# Patient Record
Sex: Male | Born: 1975 | Race: White | Hispanic: No | Marital: Single | Smoking: Never smoker
Health system: Southern US, Academic
[De-identification: ages and names within clinical notes are randomized; demographics above are authoritative.]

---

## 2004-06-18 ENCOUNTER — Ambulatory Visit (INDEPENDENT_AMBULATORY_CARE_PROVIDER_SITE_OTHER): Payer: Self-pay | Admitting: ORTHOPEDIC, SPORTS MEDICINE

## 2004-12-24 ENCOUNTER — Ambulatory Visit (INDEPENDENT_AMBULATORY_CARE_PROVIDER_SITE_OTHER): Payer: Self-pay | Admitting: Orthopaedic Surgery of the Spine

## 2005-02-18 ENCOUNTER — Other Ambulatory Visit (HOSPITAL_COMMUNITY): Payer: Self-pay | Admitting: Orthopaedic Surgery of the Spine

## 2005-02-19 ENCOUNTER — Inpatient Hospital Stay (HOSPITAL_COMMUNITY): Payer: Self-pay | Admitting: Orthopaedic Surgery of the Spine

## 2005-03-11 ENCOUNTER — Other Ambulatory Visit (INDEPENDENT_AMBULATORY_CARE_PROVIDER_SITE_OTHER): Payer: Self-pay | Admitting: Orthopaedic Surgery of the Spine

## 2005-05-13 ENCOUNTER — Other Ambulatory Visit (INDEPENDENT_AMBULATORY_CARE_PROVIDER_SITE_OTHER): Payer: Self-pay | Admitting: Orthopaedic Surgery of the Spine

## 2005-08-12 ENCOUNTER — Ambulatory Visit (INDEPENDENT_AMBULATORY_CARE_PROVIDER_SITE_OTHER): Payer: Self-pay | Admitting: Orthopaedic Surgery of the Spine

## 2006-02-17 ENCOUNTER — Ambulatory Visit (INDEPENDENT_AMBULATORY_CARE_PROVIDER_SITE_OTHER): Payer: Self-pay | Admitting: Orthopaedic Surgery of the Spine

## 2007-06-24 ENCOUNTER — Encounter (INDEPENDENT_AMBULATORY_CARE_PROVIDER_SITE_OTHER): Payer: BC Managed Care – PPO | Admitting: Orthopaedic Surgery of the Spine

## 2007-07-22 ENCOUNTER — Ambulatory Visit (INDEPENDENT_AMBULATORY_CARE_PROVIDER_SITE_OTHER): Payer: BC Managed Care – PPO

## 2007-07-22 ENCOUNTER — Other Ambulatory Visit (INDEPENDENT_AMBULATORY_CARE_PROVIDER_SITE_OTHER): Payer: Self-pay | Admitting: Orthopaedic Surgery of the Spine

## 2007-07-22 ENCOUNTER — Encounter (INDEPENDENT_AMBULATORY_CARE_PROVIDER_SITE_OTHER): Payer: BC Managed Care – PPO | Admitting: Orthopaedic Surgery of the Spine

## 2007-07-22 DIAGNOSIS — M549 Dorsalgia, unspecified: Secondary | ICD-10-CM

## 2007-07-22 DIAGNOSIS — Z09 Encounter for follow-up examination after completed treatment for conditions other than malignant neoplasm: Secondary | ICD-10-CM

## 2007-07-23 ENCOUNTER — Other Ambulatory Visit (INDEPENDENT_AMBULATORY_CARE_PROVIDER_SITE_OTHER): Payer: Self-pay | Admitting: Orthopaedic Surgery of the Spine

## 2007-08-17 ENCOUNTER — Other Ambulatory Visit (HOSPITAL_COMMUNITY): Payer: Self-pay

## 2007-08-17 ENCOUNTER — Encounter (INDEPENDENT_AMBULATORY_CARE_PROVIDER_SITE_OTHER): Payer: BC Managed Care – PPO | Admitting: Orthopaedic Surgery of the Spine

## 2007-08-25 ENCOUNTER — Ambulatory Visit (HOSPITAL_COMMUNITY): Payer: BC Managed Care – PPO

## 2007-08-25 ENCOUNTER — Other Ambulatory Visit (HOSPITAL_COMMUNITY): Payer: Self-pay

## 2007-08-31 ENCOUNTER — Ambulatory Visit (HOSPITAL_COMMUNITY): Payer: BC Managed Care – PPO

## 2007-08-31 ENCOUNTER — Encounter (INDEPENDENT_AMBULATORY_CARE_PROVIDER_SITE_OTHER): Payer: BC Managed Care – PPO | Admitting: Orthopaedic Surgery of the Spine

## 2007-08-31 ENCOUNTER — Other Ambulatory Visit (HOSPITAL_COMMUNITY): Payer: Self-pay

## 2007-09-07 ENCOUNTER — Ambulatory Visit (HOSPITAL_COMMUNITY): Payer: BC Managed Care – PPO

## 2007-09-07 ENCOUNTER — Other Ambulatory Visit (HOSPITAL_COMMUNITY): Payer: Self-pay

## 2007-09-07 ENCOUNTER — Ambulatory Visit (HOSPITAL_BASED_OUTPATIENT_CLINIC_OR_DEPARTMENT_OTHER)
Admission: RE | Admit: 2007-09-07 | Discharge: 2007-09-07 | Disposition: A | Discharge status: Home / Self Care | Payer: BC Managed Care – PPO | Source: Ambulatory Visit | Attending: Orthopaedic Surgery of the Spine | Admitting: Orthopaedic Surgery of the Spine

## 2007-09-07 ENCOUNTER — Ambulatory Visit
Admission: RE | Admit: 2007-09-07 | Discharge: 2007-09-07 | Disposition: A | Discharge status: Home / Self Care | Payer: BC Managed Care – PPO | Attending: Orthopaedic Surgery of the Spine | Admitting: Orthopaedic Surgery of the Spine

## 2007-09-07 ENCOUNTER — Ambulatory Visit (HOSPITAL_COMMUNITY): Payer: Self-pay

## 2007-09-07 ENCOUNTER — Encounter (INDEPENDENT_AMBULATORY_CARE_PROVIDER_SITE_OTHER): Payer: BC Managed Care – PPO | Admitting: Orthopaedic Surgery of the Spine

## 2007-09-07 DIAGNOSIS — M47814 Spondylosis without myelopathy or radiculopathy, thoracic region: Secondary | ICD-10-CM | POA: Insufficient documentation

## 2007-09-07 DIAGNOSIS — Z981 Arthrodesis status: Secondary | ICD-10-CM | POA: Insufficient documentation

## 2007-09-07 DIAGNOSIS — M47817 Spondylosis without myelopathy or radiculopathy, lumbosacral region: Secondary | ICD-10-CM | POA: Insufficient documentation

## 2008-09-05 ENCOUNTER — Encounter (INDEPENDENT_AMBULATORY_CARE_PROVIDER_SITE_OTHER): Payer: Self-pay | Admitting: Orthopaedic Surgery of the Spine

## 2011-03-06 ENCOUNTER — Ambulatory Visit (HOSPITAL_BASED_OUTPATIENT_CLINIC_OR_DEPARTMENT_OTHER): Payer: MEDICAID

## 2011-03-06 ENCOUNTER — Ambulatory Visit (INDEPENDENT_AMBULATORY_CARE_PROVIDER_SITE_OTHER): Payer: MEDICAID

## 2011-03-06 ENCOUNTER — Encounter (INDEPENDENT_AMBULATORY_CARE_PROVIDER_SITE_OTHER): Payer: Self-pay | Admitting: Orthopaedic Surgery of the Spine

## 2011-03-06 ENCOUNTER — Ambulatory Visit (INDEPENDENT_AMBULATORY_CARE_PROVIDER_SITE_OTHER): Payer: MEDICAID | Admitting: Orthopaedic Surgery of the Spine

## 2011-03-06 VITALS — BP 152/94 | HR 101 | Temp 96.8°F | Ht 74.5 in | Wt 346.0 lb

## 2011-03-06 DIAGNOSIS — IMO0002 Reserved for concepts with insufficient information to code with codable children: Secondary | ICD-10-CM

## 2011-03-06 DIAGNOSIS — M5416 Radiculopathy, lumbar region: Secondary | ICD-10-CM

## 2011-03-06 DIAGNOSIS — M4324 Fusion of spine, thoracic region: Secondary | ICD-10-CM

## 2011-03-06 DIAGNOSIS — M545 Low back pain, unspecified: Secondary | ICD-10-CM

## 2011-03-06 DIAGNOSIS — Q7649 Other congenital malformations of spine, not associated with scoliosis: Secondary | ICD-10-CM

## 2011-03-06 DIAGNOSIS — M546 Pain in thoracic spine: Secondary | ICD-10-CM
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
## 2011-03-06 NOTE — Progress Notes (Signed)
See dictated note.        The patient was seen as a shared visit with the co-signing faculty.

## 2011-03-06 NOTE — Progress Notes (Signed)
I personally saw and examined the patient. I agree with the plan of care as documented. See the mid-level provider's note for full details.   Everardo Beals, MD

## 2011-03-19 ENCOUNTER — Encounter (INDEPENDENT_AMBULATORY_CARE_PROVIDER_SITE_OTHER): Payer: Self-pay | Admitting: PHYSICIAN ASSISTANT

## 2011-03-24 NOTE — Letter (Signed)
[facility]                              DEPARTMENT OF [place], [place] [address]                                PATIENT NAME: Jeremy Rojas, Jeremy Rojas [facility] [id number]  DATE OF SERVICE:03/19/2011  DATE OF BIRTH: Jan 12, 1976    Mar 19, 2011    To Whom It May Concern:    As you know, Mr. Milliron is a 35 year old male who Dr. Jana Half has performed surgery on in the past.  This was because of a thoracic disk herniation and he ended up developing thoracic myelopathy. The patient came into our office complaining of some gait abnormalities as well as paresthesias in the lower extremities and some pain.  He had been started to use a cane which was inconsistent for him secondary to the fact that after his surgery he was able to get off all ambulatory aids.  He is worried about this and are we.  We had ordered MRI of the thoracic and lumbar spine to evaluate whether or not he has some spinal stenosis or nerve root compression in the lumbar spine to cause the pain and we also wanted to get an MRI of the thoracic spine to make sure that he did not have any cord compression both above or below to be causing some of the gait abnormalities that he is experiencing.  The CT scans though were ordered to check the bony anatomy both above and below the fusion.  We wanted to get at least the thoracic portion to evaluate the fusion.  The lumbar one was also to evaluate the fusion as well as the bony anatomy.  If he has a pseudoarthrosis at the surgical site, which was a T11-T12, then we may need to bone graft that or at least see what his options would be at that point.    Please feel free to contact our office for anymore questions.    Sincerely,      Vance Gather, PA-C  [place] Department of Orthopaedics    Arta Silence, MD  Professor and Chair  [facility] Department of Orthopaedics    [id number]; D: 03/19/2011 12:19:28; T: 03/20/2011 04:46:47

## 2011-03-24 NOTE — Progress Notes (Signed)
I personally saw and examined the patient. I agree with the plan of care as documented. See the mid-level provider's note for full details.   Gotham Raden, MD

## 2011-03-25 NOTE — Letter (Deleted)
[facility]                                                                            PATIENT NAME: Jeremy Rojas, Jeremy Rojas [facility] [id number]  DATE OF SERVICE:03/19/2011  DATE OF BIRTH: [birth year]    Mar 19, 2011    To Whom It May Concern:    As you know, [name] is a 35 year old male who Dr. Jana Half has performed surgery on in the past.  This was because of a thoracic disk herniation and he ended up developing thoracic myelopathy. The patient came into our office complaining of some gait abnormalities as well as paresthesias in the lower extremities and some pain.  He had been started to use a cane which was inconsistent for him secondary to the fact that after his surgery he was able to get off all ambulatory aids.  He is worried about this and are we.  We had ordered MRI of the thoracic and lumbar spine to evaluate whether or not he has some spinal stenosis or nerve root compression in the lumbar spine to cause the pain and we also wanted to get an MRI of the thoracic spine to make sure that he did not have any cord compression both above or below to be causing some of the gait abnormalities that he is experiencing.  The CT scans though were ordered to check the bony anatomy both above and below the fusion.  We wanted to get at least the thoracic portion to evaluate the fusion.  The lumbar one was also to evaluate the fusion as well as the bony anatomy.  If he has a pseudoarthrosis at the surgical site, which was a T11-T12, then we may need to bone graft that or at least see what his options would be at that point.    Please feel free to contact our office for anymore questions.    Sincerely,      Vance Gather, PA-C  [place] Department of Orthopaedics    Arta Silence, MD  Professor and Chair  [facility] Department of Orthopaedics    [id number]; D: 03/19/2011 12:19:28 T: 03/20/2011 04:46:47

## 2011-04-01 ENCOUNTER — Other Ambulatory Visit (INDEPENDENT_AMBULATORY_CARE_PROVIDER_SITE_OTHER): Payer: Self-pay | Admitting: PHYSICIAN ASSISTANT

## 2011-04-01 MED ORDER — DIAZEPAM 5 MG TABLET
ORAL_TABLET | ORAL | Status: DC
Start: 2011-04-01 — End: 2011-04-03

## 2011-04-02 ENCOUNTER — Ambulatory Visit (INDEPENDENT_AMBULATORY_CARE_PROVIDER_SITE_OTHER)
Admission: RE | Admit: 2011-04-02 | Discharge: 2011-04-02 | Disposition: A | Discharge status: Home / Self Care | Payer: MEDICAID | Source: Ambulatory Visit | Attending: Orthopaedic Surgery of the Spine | Admitting: Orthopaedic Surgery of the Spine

## 2011-04-02 ENCOUNTER — Ambulatory Visit (HOSPITAL_COMMUNITY)
Admission: RE | Admit: 2011-04-02 | Discharge: 2011-04-02 | Disposition: A | Discharge status: Home / Self Care | Payer: MEDICAID | Source: Ambulatory Visit | Attending: Orthopaedic Surgery of the Spine | Admitting: Orthopaedic Surgery of the Spine

## 2011-04-02 ENCOUNTER — Ambulatory Visit
Admission: RE | Admit: 2011-04-02 | Discharge: 2011-04-02 | Disposition: A | Discharge status: Home / Self Care | Payer: MEDICAID | Source: Ambulatory Visit | Attending: Orthopaedic Surgery of the Spine | Admitting: Orthopaedic Surgery of the Spine

## 2011-04-03 ENCOUNTER — Ambulatory Visit (INDEPENDENT_AMBULATORY_CARE_PROVIDER_SITE_OTHER): Payer: MEDICAID | Admitting: Orthopaedic Surgery of the Spine

## 2011-04-03 ENCOUNTER — Encounter (INDEPENDENT_AMBULATORY_CARE_PROVIDER_SITE_OTHER): Payer: MEDICAID | Admitting: Orthopaedic Surgery of the Spine

## 2011-04-03 ENCOUNTER — Ambulatory Visit (HOSPITAL_COMMUNITY): Payer: MEDICAID

## 2011-04-03 NOTE — Progress Notes (Signed)
To be dictated  Arta Silence, MD 04/03/2011, 10:30 AM

## 2011-04-04 NOTE — Progress Notes (Signed)
[facility] Department of Orthopaedics  [address]  [place], [place] [address]      SPORTS MEDICINE PROGRESS NOTE    PATIENT NAME: Jeremy Rojas, Jeremy Rojas  CHART NUMBER: [id number]  DATE OF BIRTH: 09-26-1976  DATE OF SERVICE: 04/03/2011    SUBJECTIVE:  Nazareth is here after having had a lumbar thoracic MRI and a plain CT.  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  We did an anterior thoracotomy and diskectomy with fusion in 2006.  He did well for a few years and he has slowly gotten worse.  He has thoracic back pain to some degree, although not terrible.  He has some burning around the right side of his chest, which by history sounds like it is related to his surgery and I am not surprised.  He is having more leg symptoms, a little bit more on the right with some pain in his legs.  He thinks his balance is off.  He has gained weight, he now weighs 330 pounds.    OBJECTIVE:  On exam, I cannot pick up any motor weakness in his lower extremities.  His knee jerks are 2+, ankle jerks 1+.  Her straight-leg raise is negative bilaterally.    Review of his plain CT shows only actual images for some reason.  I do not see any problems at any new levels.  He has some calcification of the disk below his surgical site, which is a T12-L1 disk.  I compared that to his old studies and it has not changed.  I looked at his lumbar MRI.  He has no significant pathology in his lumbar spine.  He does have the T12-L1 thoracic disk, which gives him some stenosis there, but that is really the tip of the conus and getting into the cauda equina and he has enough room there.  I do not see any other lesions.  His thoracic MRI is unremarkable.  The operative site looks okay with no recurrence of any ossification.     ASSESSMENT AND PLAN:  I am not sure why he is having these symptoms.  I think he really needs to lose weight and get back into shape.  He has been on Neurontin in the past, but he had some sort of skin reaction to it.  Other than that, I do not believe he needs any injections or any surgical consideration.  If he is getting worse, I would be happy to see him back, but we studied him up and there is nothing different there.  I will send a note to Dr. Toni Arthurs in [place].      Arta Silence, MD  Professor and Chair  [place] Department of Orthopaedics    [id number]; D: 04/03/2011 11:46:01; T: 04/04/2011 08:01:55    cc: Heywood Footman MD      [address]      [place], [place] [address]

## 2018-07-07 ENCOUNTER — Other Ambulatory Visit (INDEPENDENT_AMBULATORY_CARE_PROVIDER_SITE_OTHER): Payer: Self-pay | Admitting: Orthopaedic Surgery of the Spine

## 2018-07-07 DIAGNOSIS — G9520 Unspecified cord compression: Secondary | ICD-10-CM

## 2018-07-08 ENCOUNTER — Ambulatory Visit: Payer: Medicare Other

## 2018-07-23 ENCOUNTER — Ambulatory Visit
Admission: RE | Admit: 2018-07-23 | Discharge: 2018-07-23 | Disposition: A | Discharge status: Home / Self Care | Payer: Medicare Other | Source: Ambulatory Visit | Attending: Orthopaedic Surgery of the Spine | Admitting: Orthopaedic Surgery of the Spine

## 2018-07-23 ENCOUNTER — Ambulatory Visit (HOSPITAL_BASED_OUTPATIENT_CLINIC_OR_DEPARTMENT_OTHER)
Admission: RE | Admit: 2018-07-23 | Discharge: 2018-07-23 | Disposition: A | Discharge status: Home / Self Care | Payer: Medicare Other | Source: Ambulatory Visit

## 2018-07-23 ENCOUNTER — Ambulatory Visit (HOSPITAL_BASED_OUTPATIENT_CLINIC_OR_DEPARTMENT_OTHER): Payer: Medicare Other | Admitting: Orthopaedic Surgery of the Spine

## 2018-07-23 ENCOUNTER — Ambulatory Visit (INDEPENDENT_AMBULATORY_CARE_PROVIDER_SITE_OTHER): Payer: Medicare Other

## 2018-07-23 ENCOUNTER — Other Ambulatory Visit (INDEPENDENT_AMBULATORY_CARE_PROVIDER_SITE_OTHER): Payer: Self-pay | Admitting: Orthopaedic Surgery of the Spine

## 2018-07-23 DIAGNOSIS — M419 Scoliosis, unspecified: Secondary | ICD-10-CM

## 2018-07-23 DIAGNOSIS — M4805 Spinal stenosis, thoracolumbar region: Secondary | ICD-10-CM | POA: Insufficient documentation

## 2018-07-23 DIAGNOSIS — R52 Pain, unspecified: Secondary | ICD-10-CM

## 2018-07-23 DIAGNOSIS — G9520 Unspecified cord compression: Secondary | ICD-10-CM

## 2018-07-23 DIAGNOSIS — G8929 Other chronic pain: Secondary | ICD-10-CM | POA: Insufficient documentation

## 2018-07-23 DIAGNOSIS — M5104 Intervertebral disc disorders with myelopathy, thoracic region: Principal | ICD-10-CM

## 2018-07-23 DIAGNOSIS — M4804 Spinal stenosis, thoracic region: Secondary | ICD-10-CM

## 2018-07-23 DIAGNOSIS — M5126 Other intervertebral disc displacement, lumbar region: Secondary | ICD-10-CM

## 2018-07-23 DIAGNOSIS — Z9889 Other specified postprocedural states: Secondary | ICD-10-CM

## 2018-07-23 DIAGNOSIS — M5125 Other intervertebral disc displacement, thoracolumbar region: Secondary | ICD-10-CM | POA: Insufficient documentation

## 2018-07-23 NOTE — Progress Notes (Signed)
To be dictated  Arta Silence, MD 07/23/2018, 14:55

## 2018-07-24 ENCOUNTER — Other Ambulatory Visit (INDEPENDENT_AMBULATORY_CARE_PROVIDER_SITE_OTHER): Payer: Self-pay | Admitting: Radiology
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
## 2018-07-28 ENCOUNTER — Other Ambulatory Visit (HOSPITAL_BASED_OUTPATIENT_CLINIC_OR_DEPARTMENT_OTHER): Payer: Self-pay

## 2018-08-10 ENCOUNTER — Other Ambulatory Visit (HOSPITAL_BASED_OUTPATIENT_CLINIC_OR_DEPARTMENT_OTHER): Payer: Self-pay

## 2022-05-08 ENCOUNTER — Ambulatory Visit (HOSPITAL_COMMUNITY): Payer: Self-pay | Admitting: PAIN MANAGEMENT
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
